# Patient Record
Sex: Female | Born: 1990 | Race: Black or African American | Hispanic: No | Marital: Single | Smoking: Never smoker
Health system: Southern US, Community
[De-identification: ages and names within clinical notes are randomized; demographics above are authoritative.]

## PROBLEM LIST (undated history)

## (undated) DIAGNOSIS — Z789 Other specified health status: Secondary | ICD-10-CM

## (undated) DIAGNOSIS — Z8619 Personal history of other infectious and parasitic diseases: Secondary | ICD-10-CM

## (undated) DIAGNOSIS — IMO0002 Reserved for concepts with insufficient information to code with codable children: Secondary | ICD-10-CM

## (undated) HISTORY — DX: Personal history of other infectious and parasitic diseases: Z86.19

## (undated) HISTORY — PX: NO PAST SURGERIES: SHX2092

## (undated) HISTORY — DX: Reserved for concepts with insufficient information to code with codable children: IMO0002

---

## 2012-02-19 ENCOUNTER — Emergency Department (HOSPITAL_COMMUNITY): Payer: BC Managed Care – PPO

## 2012-02-19 ENCOUNTER — Encounter (HOSPITAL_COMMUNITY): Payer: Self-pay | Admitting: Emergency Medicine

## 2012-02-19 ENCOUNTER — Emergency Department (HOSPITAL_COMMUNITY)
Admission: EM | Admit: 2012-02-19 | Discharge: 2012-02-20 | Discharge status: Against Medical Advice | Payer: BC Managed Care – PPO | Attending: Emergency Medicine | Admitting: Emergency Medicine

## 2012-02-19 DIAGNOSIS — R079 Chest pain, unspecified: Secondary | ICD-10-CM | POA: Insufficient documentation

## 2012-02-19 NOTE — ED Notes (Signed)
PT. REPORTS MID CHEST PAIN WORSE WITH MOVEMENT AND DEEP INSPIRATION FOR 3 DAYS .

## 2012-02-20 ENCOUNTER — Emergency Department (HOSPITAL_BASED_OUTPATIENT_CLINIC_OR_DEPARTMENT_OTHER)
Admission: EM | Admit: 2012-02-20 | Discharge: 2012-02-20 | Disposition: A | Discharge status: Home / Self Care | Payer: BC Managed Care – PPO | Attending: Emergency Medicine | Admitting: Emergency Medicine

## 2012-02-20 ENCOUNTER — Encounter (HOSPITAL_BASED_OUTPATIENT_CLINIC_OR_DEPARTMENT_OTHER): Payer: Self-pay | Admitting: *Deleted

## 2012-02-20 ENCOUNTER — Emergency Department (HOSPITAL_BASED_OUTPATIENT_CLINIC_OR_DEPARTMENT_OTHER): Payer: BC Managed Care – PPO

## 2012-02-20 DIAGNOSIS — R071 Chest pain on breathing: Secondary | ICD-10-CM | POA: Insufficient documentation

## 2012-02-20 DIAGNOSIS — R0789 Other chest pain: Secondary | ICD-10-CM

## 2012-02-20 MED ORDER — HYDROCODONE-ACETAMINOPHEN 5-325 MG PO TABS
2.0000 | ORAL_TABLET | ORAL | Status: AC | PRN
Start: 2012-02-20 — End: 2012-03-01

## 2012-02-20 MED ORDER — IBUPROFEN 800 MG PO TABS: 800.0000 mg | ORAL_TABLET | Freq: Three times a day (TID) | ORAL | Status: AC

## 2012-02-20 NOTE — ED Notes (Signed)
Pt c/o chest wall pain x 3 days, seen at [facility] ED for same x 1 day ago

## 2012-02-20 NOTE — ED Provider Notes (Signed)
History     CSN: [id number]  Arrival date & time 02/20/12  1420   First MD Initiated Contact with Patient 02/20/12 1705      Chief Complaint  Patient presents with  . Pleurisy    (Consider location/radiation/quality/duration/timing/severity/associated sxs/prior treatment) Patient is a 21 y.o. female presenting with chest pain. The history is provided by the patient. No language interpreter was used.  Chest Pain The chest pain began 3 - 5 days ago. Duration of episode(s) is 3 days. Chest pain occurs constantly. The chest pain is unchanged. The pain is associated with breathing. At its most intense, the pain is at 6/10. The pain is currently at 6/10. The severity of the pain is moderate. The quality of the pain is described as sharp and pleuritic. The pain does not radiate. Chest pain is worsened by certain positions. She tried nothing for the symptoms. Risk factors include no known risk factors.  Pertinent negatives for past medical history include no diabetes and no hypertension.     History reviewed. No pertinent past medical history.  History reviewed. No pertinent past surgical history.  History reviewed. No pertinent family history.  History  Substance Use Topics  . Smoking status: Never Smoker   . Smokeless tobacco: Not on file  . Alcohol Use: No    OB History    Grav Para Term Preterm Abortions TAB SAB Ect Mult Living                  Review of Systems  Cardiovascular: Positive for chest pain.  All other systems reviewed and are negative.    Allergies  Review of patient's allergies indicates no known allergies.  Home Medications   Current Outpatient Rx  Name Route Sig Dispense Refill  . HYDROCODONE-ACETAMINOPHEN 5-325 MG PO TABS Oral Take 2 tablets by mouth every 4 (four) hours as needed for pain. 10 tablet 0  . IBUPROFEN 800 MG PO TABS Oral Take 1 tablet (800 mg total) by mouth 3 (three) times daily. 21 tablet 0    BP 120/70  Pulse 74  Temp(Src) 98.3  F (36.8 C) (Oral)  Resp 18  Ht 5\' 1"  (1.549 m)  Wt 112 lb (50.803 kg)  BMI 21.16 kg/m2  SpO2 100%  LMP 02/16/2012  Physical Exam  Vitals reviewed. Constitutional: She appears well-developed and well-nourished.  HENT:  Head: Normocephalic and atraumatic.  Right Ear: External ear normal.  Left Ear: External ear normal.  Mouth/Throat: Oropharynx is clear and moist.  Eyes: Conjunctivae are normal. Pupils are equal, round, and reactive to light.  Neck: Normal range of motion.  Cardiovascular: Normal rate.   Pulmonary/Chest: Effort normal.  Abdominal: Soft.  Musculoskeletal: Normal range of motion.  Neurological: She is alert.  Skin: Skin is warm.  Psychiatric: She has a normal mood and affect.    ED Course  Procedures (including critical care time)  Labs Reviewed - No data to display Dg Chest 2 View  02/19/2012  *RADIOLOGY REPORT*  Clinical Data: Mid chest pain and shortness of breath.  CHEST - 2 VIEW  Comparison: None.  Findings: The lungs are well-aerated and clear.  There is no evidence of focal opacification, pleural effusion or pneumothorax.  The heart is normal in size; the mediastinal contour is within normal limits.  No acute osseous abnormalities are seen.  IMPRESSION: No acute cardiopulmonary process seen.  Original Report Authenticated By: Tonia Ghent, M.D.     1. Chest wall pain  MDM  Pt given rx for ibuprofen and hydrocodone.  I gave number for primary care MD.       Elson Areas, [place] 02/20/12 2209

## 2012-02-20 NOTE — Discharge Instructions (Signed)
Chest Wall Pain Chest wall pain is pain in or around the bones and muscles of your chest. It may take up to 6 weeks to get better. It may take longer if you must stay physically active in your work and activities.  CAUSES  Chest wall pain may happen on its own. However, it may be caused by:  A viral illness like the flu.   Injury.   Coughing.   Exercise.   Arthritis.   Fibromyalgia.   Shingles.  HOME CARE INSTRUCTIONS   Avoid overtiring physical activity. Try not to strain or perform activities that cause pain. This includes any activities using your chest or your abdominal and side muscles, especially if heavy weights are used.   Put ice on the sore area.   Put ice in a plastic bag.   Place a towel between your skin and the bag.   Leave the ice on for 15 to 20 minutes per hour while awake for the first 2 days.   Only take over-the-counter or prescription medicines for pain, discomfort, or fever as directed by your caregiver.  SEEK IMMEDIATE MEDICAL CARE IF:   Your pain increases, or you are very uncomfortable.   You have a fever.   Your chest pain becomes worse.   You have new, unexplained symptoms.   You have nausea or vomiting.   You feel sweaty or lightheaded.   You have a cough with phlegm (sputum), or you cough up blood.  MAKE SURE YOU:   Understand these instructions.   Will watch your condition.   Will get help right away if you are not doing well or get worse.  Document Released: 10/22/2005 Document Revised: 10/11/2011 Document Reviewed: 06/18/2011 Exit[employer] Patient Information 2012 [employer]. 

## 2012-02-21 NOTE — ED Provider Notes (Signed)
Medical screening examination/treatment/procedure(s) were performed by non-physician practitioner and as supervising physician I was immediately available for consultation/collaboration.   Koby Pickup A Donnis Phaneuf, MD 02/21/12 0011 

## 2014-04-29 LAB — OB RESULTS CONSOLE ABO/RH: RH Type: POSITIVE

## 2014-04-29 LAB — OB RESULTS CONSOLE RPR: RPR: NONREACTIVE

## 2014-04-29 LAB — OB RESULTS CONSOLE HEPATITIS B SURFACE ANTIGEN: HEP B S AG: NEGATIVE

## 2014-04-29 LAB — OB RESULTS CONSOLE GC/CHLAMYDIA
Chlamydia: NEGATIVE
GC PROBE AMP, GENITAL: NEGATIVE

## 2014-04-29 LAB — OB RESULTS CONSOLE ANTIBODY SCREEN: Antibody Screen: NEGATIVE

## 2014-04-29 LAB — OB RESULTS CONSOLE HIV ANTIBODY (ROUTINE TESTING): HIV: NONREACTIVE

## 2014-08-25 ENCOUNTER — Other Ambulatory Visit (HOSPITAL_COMMUNITY): Payer: Self-pay | Admitting: Obstetrics and Gynecology

## 2014-08-25 DIAGNOSIS — O26843 Uterine size-date discrepancy, third trimester: Secondary | ICD-10-CM

## 2014-08-25 DIAGNOSIS — Z3A32 32 weeks gestation of pregnancy: Secondary | ICD-10-CM

## 2014-08-27 ENCOUNTER — Encounter (HOSPITAL_COMMUNITY): Payer: Self-pay

## 2014-08-27 ENCOUNTER — Ambulatory Visit (HOSPITAL_COMMUNITY)
Admission: RE | Admit: 2014-08-27 | Discharge: 2014-08-27 | Disposition: A | Discharge status: Home / Self Care | Payer: Medicaid Other | Source: Ambulatory Visit | Attending: Obstetrics and Gynecology | Admitting: Obstetrics and Gynecology

## 2014-08-27 ENCOUNTER — Other Ambulatory Visit (HOSPITAL_COMMUNITY): Payer: Self-pay | Admitting: Obstetrics and Gynecology

## 2014-08-27 DIAGNOSIS — IMO0002 Reserved for concepts with insufficient information to code with codable children: Secondary | ICD-10-CM

## 2014-08-27 DIAGNOSIS — O365930 Maternal care for other known or suspected poor fetal growth, third trimester, not applicable or unspecified: Secondary | ICD-10-CM | POA: Insufficient documentation

## 2014-08-27 DIAGNOSIS — Z3A32 32 weeks gestation of pregnancy: Secondary | ICD-10-CM | POA: Diagnosis not present

## 2014-08-27 DIAGNOSIS — O26843 Uterine size-date discrepancy, third trimester: Secondary | ICD-10-CM

## 2014-08-27 HISTORY — DX: Other specified health status: Z78.9

## 2014-08-27 NOTE — Consult Note (Signed)
MFM consultation, staff note:  Impression: SIUP at [redacted]w[redacted]d EFW <10th, AC<3rd, HC/AC 1.19 (asymmetric pattern) UA Dopplers are normal AFI is normal BPP 8/8  Discussion: Today's findings and implications were discussed by way of formal MFM consultation.  I explained to her that up to 70% of fetuses measuring less than the 10th%'le with no dysmorphic features, normal UA Doppler S/D, and AFI are actually constitutionally small for gestational age.  This is likely the case given her and the FOB's small stature.  Nonetheless, given the associated risks of LBW with adverse outcome/IUFD, close surveillance of fetal well-being and growth until delivery are warranted.    Recommendations: 1. weekly UA Doppler, AFI, BPP 2. interval growth in 3 weeks 3. If AFI, BPP, and UA Dopplers remain reassuring/normal, recommend delivery at 38-39 weeks for uncomplicated IUGR  Time Spent: I spent in excess of 30 minutes in consultation with this patient to review records, evaluate her case, and provide her with an adequate discussion and education.  More than 50% of this time was spent in direct face-to-face counseling. It was a pleasure seeing your patient in the office today.  Thank you for consultation. Please do not hesitate to contact our service for any further questions.   Thank you,  Jeffrey Morgan Denney   Denney, Jeffrey Morgan, MD, MS, FACOG Assistant Professor Section of Maternal-Fetal Medicine [employer]   

## 2014-08-27 NOTE — Progress Notes (Signed)
MFM consultation, staff note:  Impression: SIUP at [redacted]w[redacted]d EFW <10th, AC<3rd, HC/AC 1.19 (asymmetric pattern) UA Dop76plers are normal AFI is normal BPP 8/8  Discussion: Today's findings and implications were discussed by way of formal MFM consultation.  I explained to her that up to 70% of fetuses measuring less than the 10th%'le with no dysmorphic features, normal UA Doppler S/D, and AFI are actually constitutionally small for gestational age.  This is likely the case given her and the FOB's small stature.  Nonetheless, given the associated risks of LBW with adverse outcome/IUFD, close surveillance of fetal well-being and growth until delivery are warranted.    Recommendations: 1. weekly UA Doppler, AFI, BPP 2. interval growth in 3 weeks 3. If AFI, BPP, and UA Dopplers remain reassuring/normal, recommend delivery at 38-39 weeks for uncomplicated IUGR  Time Spent: I spent in excess of 30 minutes in consultation with this patient to review records, evaluate her case, and provide her with an adequate discussion and education.  More than 50% of this time was spent in direct face-to-face counseling. It was a pleasure seeing your patient in the office today.  Thank you for consultation. Please do not hesitate to contact our service for any further questions.   Thank you,  Louann SjogrenJeffrey Morgan Gaynelle Arabianenney   Dillie Burandt, Louann SjogrenJeffrey Morgan, MD, MS, FACOG Assistant Professor Section of Maternal-Fetal Medicine [facility] [employer]

## 2014-09-06 ENCOUNTER — Encounter (HOSPITAL_COMMUNITY): Payer: Self-pay

## 2014-09-07 ENCOUNTER — Ambulatory Visit (HOSPITAL_COMMUNITY)
Admission: RE | Admit: 2014-09-07 | Discharge: 2014-09-07 | Disposition: A | Discharge status: Home / Self Care | Payer: Medicaid Other | Source: Ambulatory Visit | Attending: Obstetrics and Gynecology | Admitting: Obstetrics and Gynecology

## 2014-09-07 ENCOUNTER — Other Ambulatory Visit (HOSPITAL_COMMUNITY): Payer: Self-pay | Admitting: Obstetrics and Gynecology

## 2014-09-07 DIAGNOSIS — O365930 Maternal care for other known or suspected poor fetal growth, third trimester, not applicable or unspecified: Secondary | ICD-10-CM | POA: Insufficient documentation

## 2014-09-07 DIAGNOSIS — O288 Other abnormal findings on antenatal screening of mother: Secondary | ICD-10-CM

## 2014-09-07 DIAGNOSIS — Z3A34 34 weeks gestation of pregnancy: Secondary | ICD-10-CM | POA: Insufficient documentation

## 2014-09-07 DIAGNOSIS — O365990 Maternal care for other known or suspected poor fetal growth, unspecified trimester, not applicable or unspecified: Secondary | ICD-10-CM | POA: Insufficient documentation

## 2014-09-14 ENCOUNTER — Other Ambulatory Visit (HOSPITAL_COMMUNITY): Payer: Self-pay

## 2014-09-17 ENCOUNTER — Other Ambulatory Visit (HOSPITAL_COMMUNITY): Payer: Self-pay | Admitting: Obstetrics and Gynecology

## 2014-09-17 ENCOUNTER — Ambulatory Visit (HOSPITAL_COMMUNITY)
Admission: RE | Admit: 2014-09-17 | Discharge: 2014-09-17 | Disposition: A | Discharge status: Home / Self Care | Payer: Medicaid Other | Source: Ambulatory Visit | Attending: Obstetrics and Gynecology | Admitting: Obstetrics and Gynecology

## 2014-09-17 DIAGNOSIS — O288 Other abnormal findings on antenatal screening of mother: Secondary | ICD-10-CM

## 2014-09-17 DIAGNOSIS — Z3A35 35 weeks gestation of pregnancy: Secondary | ICD-10-CM

## 2014-09-17 DIAGNOSIS — IMO0002 Reserved for concepts with insufficient information to code with codable children: Secondary | ICD-10-CM

## 2014-09-17 DIAGNOSIS — O365930 Maternal care for other known or suspected poor fetal growth, third trimester, not applicable or unspecified: Secondary | ICD-10-CM | POA: Insufficient documentation

## 2014-09-21 LAB — OB RESULTS CONSOLE GBS: GBS: NEGATIVE

## 2014-09-28 ENCOUNTER — Other Ambulatory Visit (HOSPITAL_COMMUNITY): Payer: Self-pay | Admitting: Obstetrics and Gynecology

## 2014-09-28 DIAGNOSIS — IMO0002 Reserved for concepts with insufficient information to code with codable children: Secondary | ICD-10-CM

## 2014-10-01 ENCOUNTER — Ambulatory Visit (HOSPITAL_COMMUNITY)
Admission: RE | Admit: 2014-10-01 | Discharge: 2014-10-01 | Disposition: A | Discharge status: Home / Self Care | Payer: Medicaid Other | Source: Ambulatory Visit | Attending: Obstetrics and Gynecology | Admitting: Obstetrics and Gynecology

## 2014-10-01 ENCOUNTER — Other Ambulatory Visit (HOSPITAL_COMMUNITY): Payer: Self-pay | Admitting: Obstetrics and Gynecology

## 2014-10-01 DIAGNOSIS — O365930 Maternal care for other known or suspected poor fetal growth, third trimester, not applicable or unspecified: Secondary | ICD-10-CM | POA: Diagnosis not present

## 2014-10-01 DIAGNOSIS — Z3A37 37 weeks gestation of pregnancy: Secondary | ICD-10-CM | POA: Diagnosis not present

## 2014-10-01 DIAGNOSIS — IMO0002 Reserved for concepts with insufficient information to code with codable children: Secondary | ICD-10-CM

## 2014-10-01 DIAGNOSIS — O365990 Maternal care for other known or suspected poor fetal growth, unspecified trimester, not applicable or unspecified: Secondary | ICD-10-CM | POA: Diagnosis present

## 2014-10-05 ENCOUNTER — Encounter (HOSPITAL_COMMUNITY): Payer: Self-pay | Admitting: *Deleted

## 2014-10-05 ENCOUNTER — Telehealth (HOSPITAL_COMMUNITY): Payer: Self-pay | Admitting: *Deleted

## 2014-10-05 NOTE — Telephone Encounter (Signed)
Preadmission screen  

## 2014-10-07 ENCOUNTER — Other Ambulatory Visit (HOSPITAL_COMMUNITY): Payer: Self-pay | Admitting: Obstetrics and Gynecology

## 2014-10-07 ENCOUNTER — Encounter (HOSPITAL_COMMUNITY): Payer: Self-pay | Admitting: *Deleted

## 2014-10-07 ENCOUNTER — Inpatient Hospital Stay (HOSPITAL_COMMUNITY)
Admission: AD | Admit: 2014-10-07 | Discharge: 2014-10-10 | DRG: 775 | Disposition: A | Discharge status: Home / Self Care | Payer: Medicaid Other | Source: Ambulatory Visit | Attending: Obstetrics and Gynecology | Admitting: Obstetrics and Gynecology

## 2014-10-07 DIAGNOSIS — Z3A38 38 weeks gestation of pregnancy: Secondary | ICD-10-CM | POA: Diagnosis present

## 2014-10-07 DIAGNOSIS — O288 Other abnormal findings on antenatal screening of mother: Secondary | ICD-10-CM

## 2014-10-07 DIAGNOSIS — O365930 Maternal care for other known or suspected poor fetal growth, third trimester, not applicable or unspecified: Secondary | ICD-10-CM | POA: Diagnosis present

## 2014-10-07 DIAGNOSIS — O365911 Maternal care for other known or suspected poor fetal growth, first trimester, fetus 1: Secondary | ICD-10-CM

## 2014-10-07 DIAGNOSIS — Z3A37 37 weeks gestation of pregnancy: Secondary | ICD-10-CM

## 2014-10-07 DIAGNOSIS — Z3490 Encounter for supervision of normal pregnancy, unspecified, unspecified trimester: Secondary | ICD-10-CM

## 2014-10-07 DIAGNOSIS — IMO0002 Reserved for concepts with insufficient information to code with codable children: Secondary | ICD-10-CM

## 2014-10-07 LAB — CBC
HEMATOCRIT: 34.6 % — AB (ref 36.0–46.0)
HEMOGLOBIN: 11.1 g/dL — AB (ref 12.0–15.0)
MCH: 24.3 pg — AB (ref 26.0–34.0)
MCHC: 32.1 g/dL (ref 30.0–36.0)
MCV: 75.7 fL — AB (ref 78.0–100.0)
PLATELETS: 322 10*3/uL (ref 150–400)
RBC: 4.57 MIL/uL (ref 3.87–5.11)
RDW: 15.4 % (ref 11.5–15.5)
WBC: 8.3 10*3/uL (ref 4.0–10.5)

## 2014-10-07 MED ORDER — OXYTOCIN 40 UNITS IN LACTATED RINGERS INFUSION - SIMPLE MED
62.5000 mL/h | INTRAVENOUS | Status: DC
  Filled 2014-10-07: qty 1000

## 2014-10-07 MED ORDER — LACTATED RINGERS IV SOLN
500.0000 mL | INTRAVENOUS | Status: DC | PRN
  Administered 2014-10-07 – 2014-10-08 (×2): 500 mL via INTRAVENOUS

## 2014-10-07 MED ORDER — FLEET ENEMA 7-19 GM/118ML RE ENEM: 1.0000 | ENEMA | Freq: Once | RECTAL | Status: DC

## 2014-10-07 MED ORDER — LACTATED RINGERS IV SOLN
INTRAVENOUS | Status: DC
  Administered 2014-10-07 – 2014-10-08 (×2): via INTRAVENOUS

## 2014-10-07 MED ORDER — ACETAMINOPHEN 325 MG PO TABS: 650.0000 mg | ORAL_TABLET | ORAL | Status: DC | PRN

## 2014-10-07 MED ORDER — CITRIC ACID-SODIUM CITRATE 334-500 MG/5ML PO SOLN: 30.0000 mL | ORAL | Status: DC | PRN

## 2014-10-07 MED ORDER — OXYCODONE-ACETAMINOPHEN 5-325 MG PO TABS: 2.0000 | ORAL_TABLET | ORAL | Status: DC | PRN

## 2014-10-07 MED ORDER — ZOLPIDEM TARTRATE 5 MG PO TABS: 5.0000 mg | ORAL_TABLET | Freq: Every evening | ORAL | Status: DC | PRN

## 2014-10-07 MED ORDER — TERBUTALINE SULFATE 1 MG/ML IJ SOLN: 0.2500 mg | Freq: Once | INTRAMUSCULAR | Status: AC | PRN

## 2014-10-07 MED ORDER — ONDANSETRON HCL 4 MG/2ML IJ SOLN: 4.0000 mg | Freq: Four times a day (QID) | INTRAMUSCULAR | Status: DC | PRN

## 2014-10-07 MED ORDER — LIDOCAINE HCL (PF) 1 % IJ SOLN
30.0000 mL | INTRAMUSCULAR | Status: AC | PRN
  Administered 2014-10-08: 30 mL via SUBCUTANEOUS
  Filled 2014-10-07 (×2): qty 30

## 2014-10-07 MED ORDER — MISOPROSTOL 25 MCG QUARTER TABLET
25.0000 ug | ORAL_TABLET | ORAL | Status: DC | PRN
  Administered 2014-10-07: 25 ug via VAGINAL
  Filled 2014-10-07: qty 1
  Filled 2014-10-07: qty 0.25

## 2014-10-07 MED ORDER — OXYTOCIN BOLUS FROM INFUSION
500.0000 mL | INTRAVENOUS | Status: DC
  Administered 2014-10-08: 500 mL via INTRAVENOUS

## 2014-10-07 MED ORDER — OXYCODONE-ACETAMINOPHEN 5-325 MG PO TABS: 1.0000 | ORAL_TABLET | ORAL | Status: DC | PRN

## 2014-10-08 ENCOUNTER — Encounter (HOSPITAL_COMMUNITY): Payer: Self-pay | Admitting: *Deleted

## 2014-10-08 ENCOUNTER — Inpatient Hospital Stay (HOSPITAL_COMMUNITY): Admission: RE | Admit: 2014-10-08 | Payer: BC Managed Care – PPO | Source: Ambulatory Visit

## 2014-10-08 LAB — ABO/RH: ABO/RH(D): O POS

## 2014-10-08 LAB — RPR

## 2014-10-08 LAB — TYPE AND SCREEN
ABO/RH(D): O POS
Antibody Screen: NEGATIVE

## 2014-10-08 MED ORDER — IBUPROFEN 600 MG PO TABS
600.0000 mg | ORAL_TABLET | Freq: Four times a day (QID) | ORAL | Status: DC
  Administered 2014-10-08 – 2014-10-10 (×8): 600 mg via ORAL
  Filled 2014-10-08 (×5): qty 1

## 2014-10-08 MED ORDER — OXYTOCIN 40 UNITS IN LACTATED RINGERS INFUSION - SIMPLE MED
62.5000 mL/h | INTRAVENOUS | Status: AC
  Administered 2014-10-08: 62.5 mL/h via INTRAVENOUS

## 2014-10-08 MED ORDER — DIBUCAINE 1 % RE OINT: 1.0000 "application " | TOPICAL_OINTMENT | RECTAL | Status: DC | PRN

## 2014-10-08 MED ORDER — TETANUS-DIPHTH-ACELL PERTUSSIS 5-2.5-18.5 LF-MCG/0.5 IM SUSP: 0.5000 mL | Freq: Once | INTRAMUSCULAR | Status: DC

## 2014-10-08 MED ORDER — PRENATAL MULTIVITAMIN CH
1.0000 | ORAL_TABLET | Freq: Every day | ORAL | Status: DC
  Administered 2014-10-09 – 2014-10-10 (×2): 1 via ORAL
  Filled 2014-10-08 (×2): qty 1

## 2014-10-08 MED ORDER — PRENATAL MULTIVITAMIN CH: 1.0000 | ORAL_TABLET | Freq: Every day | ORAL | Status: DC

## 2014-10-08 MED ORDER — OXYCODONE-ACETAMINOPHEN 5-325 MG PO TABS: 2.0000 | ORAL_TABLET | ORAL | Status: DC | PRN

## 2014-10-08 MED ORDER — OXYCODONE-ACETAMINOPHEN 5-325 MG PO TABS
1.0000 | ORAL_TABLET | ORAL | Status: DC | PRN
  Administered 2014-10-10: 1 via ORAL
  Filled 2014-10-08: qty 1

## 2014-10-08 MED ORDER — BUTORPHANOL TARTRATE 1 MG/ML IJ SOLN
INTRAMUSCULAR | Status: AC
  Filled 2014-10-08: qty 1

## 2014-10-08 MED ORDER — MEASLES, MUMPS & RUBELLA VAC ~~LOC~~ INJ
0.5000 mL | INJECTION | Freq: Once | SUBCUTANEOUS | Status: DC
  Filled 2014-10-08: qty 0.5

## 2014-10-08 MED ORDER — LACTATED RINGERS IV SOLN
INTRAVENOUS | Status: AC
  Administered 2014-10-08: 16:00:00 via INTRAVENOUS

## 2014-10-08 MED ORDER — ONDANSETRON HCL 4 MG PO TABS: 4.0000 mg | ORAL_TABLET | ORAL | Status: DC | PRN

## 2014-10-08 MED ORDER — SIMETHICONE 80 MG PO CHEW
80.0000 mg | CHEWABLE_TABLET | ORAL | Status: DC | PRN
  Administered 2014-10-10: 80 mg via ORAL
  Filled 2014-10-08: qty 1

## 2014-10-08 MED ORDER — SENNOSIDES-DOCUSATE SODIUM 8.6-50 MG PO TABS
2.0000 | ORAL_TABLET | ORAL | Status: DC
  Administered 2014-10-09 – 2014-10-10 (×2): 2 via ORAL
  Filled 2014-10-08: qty 2

## 2014-10-08 MED ORDER — DIPHENHYDRAMINE HCL 25 MG PO CAPS: 25.0000 mg | ORAL_CAPSULE | Freq: Four times a day (QID) | ORAL | Status: DC | PRN

## 2014-10-08 MED ORDER — ZOLPIDEM TARTRATE 5 MG PO TABS: 5.0000 mg | ORAL_TABLET | Freq: Every evening | ORAL | Status: DC | PRN

## 2014-10-08 MED ORDER — LANOLIN HYDROUS EX OINT: TOPICAL_OINTMENT | CUTANEOUS | Status: DC | PRN

## 2014-10-08 MED ORDER — WITCH HAZEL-GLYCERIN EX PADS: 1.0000 "application " | MEDICATED_PAD | CUTANEOUS | Status: DC | PRN

## 2014-10-08 MED ORDER — BUTORPHANOL TARTRATE 1 MG/ML IJ SOLN
1.0000 mg | Freq: Once | INTRAMUSCULAR | Status: AC
Start: 2014-10-08 — End: 2014-10-08
  Administered 2014-10-08: 1 mg via INTRAVENOUS

## 2014-10-08 MED ORDER — BUTORPHANOL TARTRATE 1 MG/ML IJ SOLN
1.0000 mg | Freq: Once | INTRAMUSCULAR | Status: AC
  Administered 2014-10-08: 1 mg via INTRAVENOUS

## 2014-10-08 MED ORDER — ONDANSETRON HCL 4 MG/2ML IJ SOLN: 4.0000 mg | INTRAMUSCULAR | Status: DC | PRN

## 2014-10-08 MED ORDER — BENZOCAINE-MENTHOL 20-0.5 % EX AERO
1.0000 "application " | INHALATION_SPRAY | CUTANEOUS | Status: DC | PRN
  Administered 2014-10-08: 1 via TOPICAL

## 2014-10-08 NOTE — Plan of Care (Signed)
Problem: Phase I Progression Outcomes Goal: OOB as tolerated unless otherwise ordered Outcome: Completed/Met Date Met:  10/08/14

## 2014-10-08 NOTE — Progress Notes (Signed)
Patient ID: Patricia LeavensCourtney McFarlane, female   DOB: 12-30-1990, 23 y.o.   MRN: [id number] Delivery note:  This note is written after delivery because I needed to return to my office after the delivery. The pt progressed to full dilatation and pushed well to deliver a living female infant spontaneously LOA over an intact perineum Apgars were 9 and 9 at 1 and 5 minutes. There was 1 loop of nuchal cord. The placenta delivered intact and the uterus was normal. There were bilateral labial lacerations , the left severed the labia. It was repaired with 3-0 vicryl under local block. The right was shallow, hemostatic and not repaired. The perineum was intact EBL 400 cc's.

## 2014-10-08 NOTE — Plan of Care (Signed)
Problem: Phase I Progression Outcomes Goal: Obtain and review prenatal records Outcome: Completed/Met Date Met:  10/08/14

## 2014-10-08 NOTE — Plan of Care (Signed)
Problem: Phase I Progression Outcomes Goal: Assess per MD/Nurse,Routine-VS,FHR,UC,Head to Toe assess Outcome: Completed/Met Date Met:  10/08/14

## 2014-10-08 NOTE — Progress Notes (Addendum)
Patient ID: Patricia LeavensCourtney Hanson, female   DOB: Nov 20, 1990, 23 y.o.   MRN: [id number] Pt was admitted last night for cervical ripening because of IUGR and abnormal dopplers. She received 1 dose of cytotec and established a labor pattern. She is contracting every 3 minutes with pain and the cervix is 2 cm 90% effaced and the vertex is at - 1 station. She does not want an epidural AROM produced clear fluid

## 2014-10-08 NOTE — Lactation Note (Signed)
This note was copied from the chart of Patricia Hanson. Lactation Consultation Note     Initial consult with this mom of a NICU baby, now 3 hours old. He is term at 5838 2/[redacted] weeks gestation, but severe IUGR, weighing 4 lbs 11 oz, and hypoglycemic. Mom got to breast feed the baby prior to him being transferred to NICU. I started her pumping and hand expressing, and she collected about 0.5 [place]ls [place]. I reviewed the NICU booklet with mom, and mom was able to demonstrate hand expressio with good technique. [facility] Mom should hear from them on 12/7, Monday. [facility] to home, and is aware of this program. Mom knows to call for questions/concerns.  Patient Name: Patricia Hanson ZOXWR'UToday's Date: 10/08/2014 Reason for consult: Initial assessment;NICU baby;Infant < 6lbs;Other (Comment) (38 2/7 weekas, IUGR, hypoglycemia)   Maternal Data Formula Feeding for Exclusion: Yes (baby in NICU) Has patient been taught Hand Expression?: Yes Does the patient have breastfeeding experience prior to this delivery?: No  Feeding Feeding Type: Formula Nipple Type: Slow - flow Length of feed: 10 min  LATCH Score/Interventions Latch: Repeated attempts needed to sustain latch, nipple held in mouth throughout feeding, stimulation needed to elicit sucking reflex. Intervention(s): Adjust position;Assist with latch;Breast massage  Audible Swallowing: A few with stimulation Intervention(s): Skin to skin  Type of Nipple: Flat  Comfort (Breast/Nipple): Soft / non-tender     Hold (Positioning): Assistance needed to correctly position infant at breast and maintain latch.  LATCH Score: 6  Lactation Tools Discussed/Used WIC Program: No ([facility] and get [facility]P) Pump Review: Setup, frequency, and cleaning;Milk Storage;Other (comment) (premie setting, hand expression) Initiated by:: clee rn lc Date  initiated:: 10/08/14   Consult Status Date: 10/09/14 Follow-up type: In-patient    Alfred LevinsLee, Isac Lincks Anne 10/08/2014, 4:38 PM

## 2014-10-09 LAB — CBC
HEMATOCRIT: 27.5 % — AB (ref 36.0–46.0)
HEMOGLOBIN: 8.7 g/dL — AB (ref 12.0–15.0)
MCH: 24 pg — ABNORMAL LOW (ref 26.0–34.0)
MCHC: 31.6 g/dL (ref 30.0–36.0)
MCV: 75.8 fL — AB (ref 78.0–100.0)
Platelets: 196 10*3/uL (ref 150–400)
RBC: 3.63 MIL/uL — AB (ref 3.87–5.11)
RDW: 15.3 % (ref 11.5–15.5)
WBC: 13.2 10*3/uL — AB (ref 4.0–10.5)

## 2014-10-09 NOTE — Progress Notes (Signed)
Patient ID: Patricia LeavensCourtney Hanson, female   DOB: [birth year], 23 y.o.   MRN: [id number] #1 afebrile BP normal HGB 11.1 to 8.7 Baby seems to be doing well BS are better

## 2014-10-10 LAB — COMPREHENSIVE METABOLIC PANEL
ALBUMIN: 2.3 g/dL — AB (ref 3.5–5.2)
ALT: 18 U/L (ref 0–35)
ANION GAP: 11 (ref 5–15)
AST: 25 U/L (ref 0–37)
Alkaline Phosphatase: 121 U/L — ABNORMAL HIGH (ref 39–117)
BUN: 14 mg/dL (ref 6–23)
CHLORIDE: 105 meq/L (ref 96–112)
CO2: 23 mEq/L (ref 19–32)
Calcium: 8.6 mg/dL (ref 8.4–10.5)
Creatinine, Ser: 0.73 mg/dL (ref 0.50–1.10)
GFR calc non Af Amer: >90 mL/min (ref >90)
GLUCOSE: 77 mg/dL (ref 70–99)
Potassium: 4.1 mEq/L (ref 3.7–5.3)
Sodium: 139 mEq/L (ref 137–147)
Total Protein: 5.4 g/dL — ABNORMAL LOW (ref 6.0–8.3)

## 2014-10-10 LAB — CBC WITH DIFFERENTIAL/PLATELET
BASOS PCT: 0 % (ref 0–1)
Basophils Absolute: 0 10*3/uL (ref 0.0–0.1)
Eosinophils Absolute: 0.2 10*3/uL (ref 0.0–0.7)
Eosinophils Relative: 1 % (ref 0–5)
HCT: 28.9 % — ABNORMAL LOW (ref 36.0–46.0)
HEMOGLOBIN: 9.3 g/dL — AB (ref 12.0–15.0)
Lymphocytes Relative: 15 % (ref 12–46)
Lymphs Abs: 1.7 10*3/uL (ref 0.7–4.0)
MCH: 24.4 pg — AB (ref 26.0–34.0)
MCHC: 32.2 g/dL (ref 30.0–36.0)
MCV: 75.9 fL — ABNORMAL LOW (ref 78.0–100.0)
Monocytes Absolute: 1 10*3/uL (ref 0.1–1.0)
Monocytes Relative: 8 % (ref 3–12)
Neutro Abs: 8.9 10*3/uL — ABNORMAL HIGH (ref 1.7–7.7)
Neutrophils Relative %: 76 % (ref 43–77)
Platelets: 156 10*3/uL (ref 150–400)
RBC: 3.81 MIL/uL — ABNORMAL LOW (ref 3.87–5.11)
RDW: 15.4 % (ref 11.5–15.5)
WBC: 11.7 10*3/uL — ABNORMAL HIGH (ref 4.0–10.5)

## 2014-10-10 MED ORDER — IBUPROFEN 600 MG PO TABS: 600.0000 mg | ORAL_TABLET | Freq: Four times a day (QID) | ORAL | Status: AC | PRN

## 2014-10-10 NOTE — Lactation Note (Signed)
This note was copied from the chart of Patricia Hanson. Lactation Consultation Note  Follow up visit made.  Mom states pumping is going well and volume is increasing.  Mom plans to purchase a pump after discharge.  Encouraged to call with any concerns.  Patient Name: Patricia Hanson ZOXWR'UToday's Date: 10/10/2014     Maternal Data    Feeding    LATCH Score/Interventions                      Lactation Tools Discussed/Used     Consult Status      Huston FoleyMOULDEN, Natalea Sutliff S 10/10/2014, 3:18 PM

## 2014-10-10 NOTE — Progress Notes (Signed)
Patient ID: Patricia LeavensCourtney Hanson, female   DOB: [birth year], 23 y.o.   MRN: [id number] I came to see the pt and d/c her but she claims to feel weak and have a headache as well as mid abdominal pain She states she needs to be here another day. I will check PIH labs even though her BP's are normal

## 2014-10-10 NOTE — Discharge Instructions (Signed)
booklet °

## 2014-10-10 NOTE — Progress Notes (Signed)
Discharge instructions provided to patient and significant other at bedside.  Activity, medications, follow up appointments, when to call the doctor and community resources discussed.  No questions at this time.  Patient left unit in stable condition with all personal belongings and prescriptions accompanied by staff.  K. Sheliah Fiorillo, RN------------------------   

## 2014-10-10 NOTE — Discharge Summary (Signed)
NAMRhona Leavens:  Hanson, Patricia          ACCOUNT NO.:  [id number]  MEDICAL RECORD NO.:  [id number]  LOCATION:  9305                          FACILITY:  [facility]  PHYSICIAN:  Malachi Prohomas F. Ambrose MantleHenley, M.D. DATE OF BIRTH:  [birth year]  DATE OF ADMISSION:  10/07/2014 DATE OF DISCHARGE:  10/10/2014                              DISCHARGE SUMMARY   HOSPITAL COURSE:  This is a 23 year old black female at 38 weeks and 2 days who was admitted for Cytotec ripening of the cervix because of significant intrauterine growth restriction.  The patient was placed on Cytotec on the evening of December 3 and the morning of December 4, the cervix had improved.  She never required Pitocin.  She went into active labor, progressed to full dilatation and delivered a living 4-pound 11- ounce female infant spontaneously vertex.  Because of the baby's weight and low blood sugar, the baby went to the NICU and is still in the NICU. Blood sugars have now been well controlled.  The patient did well postpartum.  On the second postpartum day, she did not feel like going home, so I repeated some labs, she had a headache and some abdominal pain.  I repeated the labs and they were completely normal.  Her initial hemoglobin was 11.1, hematocrit 34.6, white count 8300, platelet count 322,000.  Follow up hemoglobin on December 5 was 8.7, hematocrit 27.5, white count 13,200, and the hemoglobin on the day of discharge 9.3, hematocrit 28.9, white count 11,700, platelet count 156,000.  BUN was 14, creatinine 0.73.  SGOT and PT were 25 and 18.  Glomerular filtration rate greater than 90.  FINAL DIAGNOSES:  Intrauterine pregnancy, 38 weeks and 2 days, significant intrauterine growth restriction.  OPERATION:  Spontaneous delivery, vertex.  FINAL CONDITION:  Improved.  INSTRUCTIONS:  Include our regular discharge instruction booklet as well as the after visit summary, prescription for Motrin 600 mg 30 tablets, 1 every 6 hours as needed for pain  is given at discharge.  She is advised to resume her prenatal vitamins and take ferrous sulfate 325 mg twice daily.  Return to the office in 6 weeks for followup examination.  She plans circumcision in the office but I have advised her that I do not like to do circumcisions in my office after the baby is 23 weeks of age. If the baby is not ready for discharge within 2 weeks, she can call me and I can check the baby's penis to see if it is still small enough to be done.     Malachi Prohomas F. Ambrose MantleHenley, M.D.     TFH/MEDQ  D:  10/10/2014  T:  10/10/2014  Job:  [id number]

## 2014-10-10 NOTE — Plan of Care (Signed)
Problem: Consults Goal: Postpartum Patient Education (See Patient Education module for education specifics.)  Outcome: Completed/Met Date Met:  10/10/14  Problem: Phase II Progression Outcomes Goal: Pain controlled on oral analgesia Outcome: Completed/Met Date Met:  10/10/14 Good pain control on po Motrin. Goal: Progress activity as tolerated unless otherwise ordered Outcome: Completed/Met Date Met:  10/10/14 Walks frequently to NICU and tolerates well.    Goal: Afebrile, VS remain stable Outcome: Completed/Met Date Met:  10/10/14 VSS at this time.    Goal: Incision intact & without signs/symptoms of infection Outcome: Not Applicable Date Met:  12/28/34 Goal: Tolerating diet Outcome: Completed/Met Date Met:  10/10/14 Tolerating Regular diet well. Goal: Other Phase II Outcomes/Goals Outcome: Completed/Met Date Met:  10/10/14 Has had a BM x 2.  Problem: Discharge Progression Outcomes Goal: Barriers To Progression Addressed/Resolved Outcome: Completed/Met Date Met:  10/10/14 Able to visit infant son frequently. Goal: Activity appropriate for discharge plan Outcome: Completed/Met Date Met:  10/10/14 Goal: Tolerating diet Outcome: Completed/Met Date Met:  10/10/14 Goal: Pain controlled with appropriate interventions Outcome: Completed/Met Date Met:  10/10/14 Goal: Afebrile, VS remain stable at discharge Outcome: Completed/Met Date Met:  10/10/14 Goal: Discharge plan in place and appropriate Outcome: Completed/Met Date Met:  10/10/14 VSS Pain controlled Vaginal bleeding WNL Understands self care Understands f/u care

## 2014-10-10 NOTE — Progress Notes (Signed)
Patient ID: Patricia LeavensCourtney Hanson, female   DOB: 02-26-1991, 23 y.o.   MRN: [id number] Pt is now ready for d/c Labs were normal

## 2014-10-11 NOTE — Progress Notes (Signed)
Post discharge review completed. 

## 2014-10-12 ENCOUNTER — Ambulatory Visit: Payer: Self-pay

## 2014-10-12 NOTE — Lactation Note (Signed)
This note was copied from the chart of Boy Rhona LeavensCourtney McFarlane. Lactation Consultation Note       Follow up consult with this mom and baby, now 784 days old, full term, SGA, weighing 4 lbs 12.5 oz. I assisted mom with latching baby in cross cradle hold, and did a pre and post weight. He transferred 22 mls in about 25 minutes, and then was fed EBm in a bottle. I told mom to pre pump with next feeding, for baby to get a deeper latch and hopefully transfer more. Mom doing very well, and is very motivated to breast feed. Mom aware it may take time to get him to full breast feeding, and he will do better as he get bigger. Mom knows to continue latching him while in the NICU, and that she can be seen in o/p lactation after baby is discharged to home.   Patient Name: Boy Rhona LeavensCourtney McFarlane QMVHQ'IToday's Date: 10/12/2014 Reason for consult: Follow-up assessment   Maternal Data    Feeding Feeding Type: Breast Fed Length of feed: 25 min  LATCH Score/Interventions Latch: Grasps breast easily, tongue down, lips flanged, rhythmical sucking. Intervention(s): Skin to skin;Teach feeding cues;Waking techniques Intervention(s): Adjust position;Assist with latch;Breast compression  Audible Swallowing: A few with stimulation Intervention(s): Skin to skin;Hand expression  Type of Nipple: Everted at rest and after stimulation  Comfort (Breast/Nipple): Filling, red/small blisters or bruises, mild/mod discomfort  Problem noted: Filling (will suggest mom pre pump some efore next feeding, so see if deeper latch will increase transfer of milk)  Hold (Positioning): Assistance needed to correctly position infant at breast and maintain latch. Intervention(s): Breastfeeding basics reviewed;Support Pillows;Position options;Skin to skin  LATCH Score: 7  Lactation Tools Discussed/Used     Consult Status Consult Status: Follow-up Date: 10/13/14 Follow-up type: In-patient    Alfred LevinsLee, Staley Lunz Anne 10/12/2014, 5:43  PM

## 2014-10-12 NOTE — H&P (Signed)
NAMRhona Leavens:  MCFARLANE, Shila          ACCOUNT NO.:  [id number]  MEDICAL RECORD NO.:  [id number]  LOCATION:                                 FACILITY:  PHYSICIAN:  Malachi Prohomas F. Ambrose MantleHenley, M.D. DATE OF BIRTH:  September 17, 1991  DATE OF ADMISSION:  10/07/2014 DATE OF DISCHARGE:                             HISTORY & PHYSICAL   PRESENT ILLNESS:  This is a 23 year old black female, para 0, gravida 1, EDC on October 20, 2014, admitted for induction of labor because of a small for dates infant.  This patient had her initial prenatal exam at 15 weeks and 5 days.  [facility] April 23, 2014, 14 weeks 2 days[facility].  Her due date was October 17, 2015.  Blood group and type O positive, negative antibody, hepatitis B surface antigen negative, HIV negative, GC and Chlamydia negative.  Varicella immune.  Rubella immune.  Pap test normal.  The patient declined prenatal screening tests.  One hour Glucola was 123.  Repeat HIV and RPR negative.  Gonorrhea and Chlamydia negative.  Group B strep negative. After her first prenatal visit, the patient was seen at 19 weeks and seemed to be progressing normally.  At 27 weeks, her fundal height was 27 cm, but at 29 weeks, it was 26 cm.  She underwent an ultrasound for size less than dates.  The estimated fetal weight was in the 13th percentile.  Amniotic fluid was normal.  Dopplers were normal.  The patient was begun on nonstress test twice a week.  She continued on those until the present time.  At 32 weeks, her estimated fetal weight was in the less than 10th percentile.  AFI and Dopplers were normal. The patient was advised to see MFM.  Ultrasound at MFM was less than the 10th percentile for growth.  Dopplers were normal.  Biophysical was 8/8. Nonstress tests were continued with AFIs and Dopplers on a weekly basis. The patient was felt to be a candidate for induction at 38 weeks and she is admitted now for induction of labor.  PAST MEDICAL  HISTORY:  No significant infections or other childhood illnesses.  PAST SURGICAL HISTORY:  None.  ALLERGIES:  No known drug allergies.  No latex allergy.  SOCIAL HISTORY:  Never smoked.  Moderate alcohol intake before pregnancy.  None during pregnancy.  Denied illicit drugs.  Single.  Does have a supportive partner.  FAMILY HISTORY:  Father with high blood pressure.  PHYSICAL EXAMINATION:  VITAL SIGNS:  Blood pressure 108/70, pulse 80. HEART:  Normal size and sounds.  No murmurs. LUNGS:  Clear to auscultation. Fundal height 32 cm.  Fetal heart tones normal.  Cervix not dilated, 60% vertex, -2 station.  ADMITTING IMPRESSION:  Intrauterine pregnancy at 38 weeks and 2 days by ultrasound at 14 weeks, 38 weeks and 6 days by last.  Significant growth restriction.  The patient is admitted for induction of labor.     Malachi Prohomas F. Ambrose MantleHenley, M.D.     TFH/MEDQ  D:  10/07/2014  T:  10/07/2014  Job:  [id number]

## 2014-10-13 ENCOUNTER — Ambulatory Visit: Payer: Self-pay

## 2014-10-13 NOTE — Lactation Note (Signed)
This note was copied from the chart of Patricia Rhona LeavensCourtney McFarlane. Lactation Consultation Note  Feeding assist with mom in NICU.  Pre and post weight done.  Baby latched with first attempt using cross cradle hold.  Latch deep and active sucking.  Baby nursed for 15 minutes and transferred 14 mls.  Mom pumped 40 minutes prior to breastfeeding.  Vital signs remained stable throughout feeding.  Mom has good supply and encouraged to continue pumping every 3 hours.  Patient Name: Patricia Rhona LeavensCourtney McFarlane QIONG'EToday's Date: 10/13/2014 Reason for consult: Follow-up assessment;NICU baby;Infant < 6lbs;Other (Comment) (LOW BLOOD SUGARS)   Maternal Data    Feeding Feeding Type: Breast Fed Length of feed: 15 min  LATCH Score/Interventions Latch: Grasps breast easily, tongue down, lips flanged, rhythmical sucking. Intervention(s): Skin to skin;Teach feeding cues;Waking techniques Intervention(s): Breast compression;Breast massage;Assist with latch;Adjust position  Audible Swallowing: Spontaneous and intermittent Intervention(s): Alternate breast massage;Hand expression;Skin to skin  Type of Nipple: Everted at rest and after stimulation  Comfort (Breast/Nipple): Soft / non-tender     Hold (Positioning): No assistance needed to correctly position infant at breast. Intervention(s): Breastfeeding basics reviewed;Support Pillows;Position options;Skin to skin  LATCH Score: 10  Lactation Tools Discussed/Used     Consult Status Consult Status: PRN    Huston FoleyMOULDEN, Charisse Wendell S 10/13/2014, 3:51 PM

## 2014-10-18 ENCOUNTER — Ambulatory Visit: Payer: Self-pay

## 2014-10-18 NOTE — Lactation Note (Signed)
This note was copied from the chart of [place]. Lactation Consultation Note  Met with mom prior to baby's discharge.  Mom continues to pump every 3 hours and has a good milk supply.  She also puts baby to breast at some feedings.  Encouraged mom to schedule a lactation outpatient appointment for feeding assessment.  Patient Name: Patricia Hanson [id number] Date: 10/18/2014     Maternal Data    Feeding    LATCH Score/Interventions                      Lactation Tools Discussed/Used     Consult Status      Ave Filter 10/18/2014, 12:26 PM

## 2021-10-03 ENCOUNTER — Other Ambulatory Visit: Payer: Self-pay

## 2021-10-03 ENCOUNTER — Emergency Department (HOSPITAL_BASED_OUTPATIENT_CLINIC_OR_DEPARTMENT_OTHER): Payer: 59

## 2021-10-03 ENCOUNTER — Emergency Department (HOSPITAL_BASED_OUTPATIENT_CLINIC_OR_DEPARTMENT_OTHER)
Admission: EM | Admit: 2021-10-03 | Discharge: 2021-10-03 | Disposition: A | Discharge status: Home / Self Care | Payer: 59 | Attending: Emergency Medicine | Admitting: Emergency Medicine

## 2021-10-03 ENCOUNTER — Encounter (HOSPITAL_BASED_OUTPATIENT_CLINIC_OR_DEPARTMENT_OTHER): Payer: Self-pay

## 2021-10-03 DIAGNOSIS — M545 Low back pain, unspecified: Secondary | ICD-10-CM | POA: Insufficient documentation

## 2021-10-03 DIAGNOSIS — R519 Headache, unspecified: Secondary | ICD-10-CM | POA: Diagnosis not present

## 2021-10-03 DIAGNOSIS — Y92410 Unspecified street and highway as the place of occurrence of the external cause: Secondary | ICD-10-CM | POA: Diagnosis not present

## 2021-10-03 DIAGNOSIS — R079 Chest pain, unspecified: Secondary | ICD-10-CM | POA: Diagnosis not present

## 2021-10-03 MED ORDER — METHOCARBAMOL 500 MG PO TABS
500.0000 mg | ORAL_TABLET | Freq: Once | ORAL | Status: AC
  Administered 2021-10-03: 500 mg via ORAL

## 2021-10-03 MED ORDER — METHOCARBAMOL 500 MG PO TABS: 500.0000 mg | ORAL_TABLET | Freq: Two times a day (BID) | ORAL | 0 refills | Status: AC

## 2021-10-03 MED ORDER — ACETAMINOPHEN 325 MG PO TABS
650.0000 mg | ORAL_TABLET | Freq: Once | ORAL | Status: AC
  Administered 2021-10-03: 650 mg via ORAL
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
